# Patient Record
Sex: Female | Born: 1985 | Race: White | Hispanic: No | Marital: Single | Smoking: Never smoker
Health system: Southern US, Community
[De-identification: ages and names within clinical notes are randomized; demographics above are authoritative.]

## PROBLEM LIST (undated history)

## (undated) DIAGNOSIS — R55 Syncope and collapse: Secondary | ICD-10-CM

## (undated) DIAGNOSIS — F32A Depression, unspecified: Secondary | ICD-10-CM

## (undated) DIAGNOSIS — K589 Irritable bowel syndrome without diarrhea: Secondary | ICD-10-CM

## (undated) DIAGNOSIS — G43909 Migraine, unspecified, not intractable, without status migrainosus: Secondary | ICD-10-CM

## (undated) HISTORY — PX: RECONSTRUCTION / CORRECTION OF NIPPLE / AEROLA: SUR1073

## (undated) HISTORY — PX: KNEE ARTHROSCOPY: SHX127

---
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
## 2005-05-10 ENCOUNTER — Other Ambulatory Visit: Admission: RE | Admit: 2005-05-10 | Discharge: 2005-05-10 | Payer: Self-pay | Admitting: Gynecology

## 2006-05-11 ENCOUNTER — Other Ambulatory Visit: Admission: RE | Admit: 2006-05-11 | Discharge: 2006-05-11 | Payer: Self-pay | Admitting: Gynecology

## 2007-02-22 ENCOUNTER — Emergency Department (HOSPITAL_COMMUNITY): Admission: EM | Admit: 2007-02-22 | Discharge: 2007-02-22 | Payer: Self-pay | Admitting: Emergency Medicine

## 2007-05-09 ENCOUNTER — Other Ambulatory Visit: Admission: RE | Admit: 2007-05-09 | Discharge: 2007-05-09 | Payer: Self-pay | Admitting: Gynecology

## 2008-03-15 ENCOUNTER — Other Ambulatory Visit: Admission: RE | Admit: 2008-03-15 | Discharge: 2008-03-15 | Payer: Self-pay | Admitting: Gynecology

## 2011-01-12 LAB — POCT I-STAT CREATININE
Creatinine, Ser: 1.1
Operator id: 288331

## 2011-01-12 LAB — I-STAT 8, (EC8 V) (CONVERTED LAB)
Acid-base deficit: 3 — ABNORMAL HIGH
Operator id: 288331
TCO2: 22
pCO2, Ven: 32.8 — ABNORMAL LOW
pH, Ven: 7.421 — ABNORMAL HIGH

## 2011-01-12 LAB — POCT PREGNANCY, URINE
Operator id: 288831
Preg Test, Ur: NEGATIVE

## 2011-01-12 LAB — RAPID URINE DRUG SCREEN, HOSP PERFORMED
Barbiturates: NOT DETECTED
Benzodiazepines: NOT DETECTED
Cocaine: NOT DETECTED
Opiates: NOT DETECTED

## 2011-01-12 LAB — POCT CARDIAC MARKERS: Troponin i, poc: <0.05

## 2018-02-24 ENCOUNTER — Other Ambulatory Visit: Payer: Self-pay | Admitting: Obstetrics and Gynecology

## 2018-02-24 DIAGNOSIS — N644 Mastodynia: Secondary | ICD-10-CM

## 2018-03-06 ENCOUNTER — Ambulatory Visit
Admission: RE | Admit: 2018-03-06 | Discharge: 2018-03-06 | Disposition: A | Discharge status: Home / Self Care | Payer: PRIVATE HEALTH INSURANCE | Source: Ambulatory Visit | Attending: Obstetrics and Gynecology | Admitting: Obstetrics and Gynecology

## 2018-03-06 DIAGNOSIS — N644 Mastodynia: Secondary | ICD-10-CM

## 2020-01-07 ENCOUNTER — Emergency Department: Admit: 2020-01-07 | Discharge status: Absent without leave | Payer: PRIVATE HEALTH INSURANCE | Source: Home / Self Care

## 2020-01-07 ENCOUNTER — Emergency Department (INDEPENDENT_AMBULATORY_CARE_PROVIDER_SITE_OTHER)
Admission: EM | Admit: 2020-01-07 | Discharge: 2020-01-07 | Disposition: A | Discharge status: Home / Self Care | Payer: BLUE CROSS/BLUE SHIELD | Source: Home / Self Care

## 2020-01-07 ENCOUNTER — Other Ambulatory Visit: Payer: Self-pay

## 2020-01-07 DIAGNOSIS — H6692 Otitis media, unspecified, left ear: Secondary | ICD-10-CM | POA: Diagnosis not present

## 2020-01-07 DIAGNOSIS — H9203 Otalgia, bilateral: Secondary | ICD-10-CM | POA: Diagnosis not present

## 2020-01-07 HISTORY — DX: Depression, unspecified: F32.A

## 2020-01-07 HISTORY — DX: Migraine, unspecified, not intractable, without status migrainosus: G43.909

## 2020-01-07 HISTORY — DX: Irritable bowel syndrome, unspecified: K58.9

## 2020-01-07 MED ORDER — FLUTICASONE PROPIONATE 50 MCG/ACT NA SUSP: 2.0000 | Freq: Every day | NASAL | 2 refills | Status: DC

## 2020-01-07 MED ORDER — AMOXICILLIN-POT CLAVULANATE 875-125 MG PO TABS: 1.0000 | ORAL_TABLET | Freq: Two times a day (BID) | ORAL | 0 refills | Status: DC

## 2020-01-07 NOTE — ED Triage Notes (Signed)
Pt presents to Urgent Care with c/o bilateral ear pain. 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 Actual pain began today. No known covid exposure and pt fully vaccinated.

## 2020-01-07 NOTE — ED Provider Notes (Signed)
[facility]    CSN: [id number] Arrival date & time: 01/07/20  1233      History   Chief Complaint Chief Complaint  Patient presents with  . Otalgia    bilateral    HPI Jacqueline Mccarthy is a 34 y.o. female.   HPI Jacqueline Mccarthy is a 34 y.o. female presenting to UC with c/o bilateral ear pain and pressure. 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 She has had increased pressure the last few weeks but developed pain today. Pain is sharp and aching.  Denies fever, chills, n/v/d. No congestion, HA or dizziness. She has not tried anything for her symptoms because she cannot take a lot of medications due to her occupation. She is fully vaccinated for COVID.    Past Medical History:  Diagnosis Date  . Depression   . IBS (irritable bowel syndrome)   . Migraines     There are no problems to display for this patient.   History reviewed. No pertinent surgical history.  OB History   No obstetric history on file.      Home Medications    Prior to Admission medications   Medication Sig Start Date End Date Taking? Authorizing Provider  buPROPion (WELLBUTRIN XL) 300 MG 24 hr tablet Take 300 mg by mouth 1 day or 1 dose. 10/29/19  Yes [provider]  norethindrone-ethinyl estradiol 1/35 (ORTHO-NOVUM) tablet Take 1 tablet by mouth daily. 02/22/14  Yes [provider]  pantoprazole (PROTONIX) 40 MG tablet Take 1 tablet by mouth daily. 08/01/19  Yes [provider]  amoxicillin-clavulanate (AUGMENTIN) 875-125 MG tablet Take 1 tablet by mouth 2 (two) times daily. One po bid x 7 days 01/07/20   Noe Gens, PA-C  fluticasone [facility]) 50 MCG/ACT nasal spray Place 2 sprays into both nostrils daily. 01/07/20   Noe Gens, PA-C  loperamide (IMODIUM) 2 MG capsule Take 2 mg by mouth daily.    [provider]    Family History Family  History  Problem Relation Age of Onset  . Rheum arthritis Mother   . Heart attack Father     Social History Social History   Tobacco Use  . Smoking status: Never Smoker  . Smokeless tobacco: Never Used  Substance Use Topics  . Alcohol use: Not Currently  . Drug use: Not Currently     Allergies   Contrast media [iodinated diagnostic agents]   Review of Systems Review of Systems  Constitutional: Negative for chills and fever.  HENT: Positive for ear pain. Negative for congestion, sore throat, trouble swallowing and voice change.   Respiratory: Negative for cough and shortness of breath.   Cardiovascular: Negative for chest pain and palpitations.  Gastrointestinal: Negative for abdominal pain, diarrhea, nausea and vomiting.  Musculoskeletal: Negative for arthralgias, back pain and myalgias.  Skin: Negative for rash.  Neurological: Negative for dizziness, light-headedness and headaches.  All other systems reviewed and are negative.    Physical Exam Triage Vital Signs ED Triage Vitals  Enc Vitals Group     BP 01/07/20 1256 (!) 147/88     Pulse Rate 01/07/20 1256 96     Resp 01/07/20 1256 20     Temp 01/07/20 1256 (!) 97.4 F (36.3 C)     Temp Source 01/07/20 1256 Oral     SpO2 01/07/20 1256 98 %  Weight --      Height --      Head Circumference --      Peak Flow --      Pain Score 01/07/20 1254 3     Pain Loc --      Pain Edu? --      Excl. in [place]? --    No data found.  Updated Vital Signs BP (!) 147/88 (BP Location: Right Arm)   Pulse 96   Temp (!) 97.4 F (36.3 C) (Oral)   Resp 20   LMP 12/27/2019 (Approximate)   SpO2 98%   Visual Acuity Right Eye Distance:   Left Eye Distance:   Bilateral Distance:    Right Eye Near:   Left Eye Near:    Bilateral Near:     Physical Exam Vitals and nursing note reviewed.  Constitutional:      General: She is not in acute distress.    Appearance: Normal appearance. She is well-developed. She is not  ill-appearing, toxic-appearing or diaphoretic.  HENT:     Head: Normocephalic and atraumatic.     Right Ear: Tympanic membrane and ear canal normal.     Left Ear: Tympanic membrane and ear canal normal.     Nose: Nose normal.     Right Sinus: No maxillary sinus tenderness or frontal sinus tenderness.     Left Sinus: No maxillary sinus tenderness or frontal sinus tenderness.     Mouth/Throat:     Lips: Pink.     Mouth: Mucous membranes are moist.     Pharynx: Oropharynx is clear. Uvula midline. No pharyngeal swelling, oropharyngeal exudate, posterior oropharyngeal erythema or uvula swelling.  Cardiovascular:     Rate and Rhythm: Normal rate and regular rhythm.  Pulmonary:     Effort: Pulmonary effort is normal. No respiratory distress.     Breath sounds: Normal breath sounds. No stridor. No wheezing, rhonchi or rales.  Musculoskeletal:        General: Normal range of motion.     Cervical back: Normal range of motion and neck supple. No tenderness.  Lymphadenopathy:     Cervical: No cervical adenopathy.  Skin:    General: Skin is warm and dry.  Neurological:     Mental Status: She is alert and oriented to person, place, and time.  Psychiatric:        Behavior: Behavior normal.      UC Treatments / Results  Labs (all labs ordered are listed, but only abnormal results are displayed) Labs Reviewed - No data to display  EKG   Radiology No results found.  Procedures Procedures (including critical care time)  Medications Ordered in UC Medications - No data to display  Initial Impression / Assessment and Plan / UC Course  I have reviewed the triage vital signs and the nursing notes.  Pertinent labs & imaging results that were available during my care of the patient were reviewed by me and considered in my medical decision making (see chart for details).     Tympanometry: RIGHT Ear: Normal. LEFT Ear: tympanogram is Wide Will tx for subacute AOM of Left ear Home care  instructions discussed F/u with PCP as needed  Final Clinical Impressions(s) / UC Diagnoses   Final diagnoses:  Acute ear pain, bilateral  Left acute otitis media     Discharge Instructions      Please take antibiotics as prescribed and be sure to complete entire course even if you start to feel better to  ensure infection does not come back.  You may take 500mg  acetaminophen every 4-6 hours or in combination with ibuprofen 400-600mg  every 6-8 hours as needed for pain, inflammation, and fever.  Be sure to well hydrated with clear liquids and get at least 8 hours of sleep at night, preferably more while sick.   Please follow up with family medicine in 1 week if needed.     ED Prescriptions    Medication Sig Dispense Auth. Provider   amoxicillin-clavulanate (AUGMENTIN) 875-125 MG tablet Take 1 tablet by mouth 2 (two) times daily. One po bid x 7 days 14 tablet Demetria Lightsey O, PA-C   fluticasone (FLONASE) 50 MCG/ACT nasal spray Place 2 sprays into both nostrils daily. 16 g Noe Gens, [place]     PDMP not reviewed this encounter.   Noe Gens, [place] 01/07/20 1424

## 2020-01-07 NOTE — Discharge Instructions (Signed)

## 2021-05-29 ENCOUNTER — Other Ambulatory Visit: Payer: Self-pay | Admitting: Obstetrics and Gynecology

## 2021-05-29 DIAGNOSIS — Z1231 Encounter for screening mammogram for malignant neoplasm of breast: Secondary | ICD-10-CM

## 2021-07-10 ENCOUNTER — Ambulatory Visit
Admission: RE | Admit: 2021-07-10 | Discharge: 2021-07-10 | Disposition: A | Discharge status: Home / Self Care | Payer: BC Managed Care – PPO | Source: Ambulatory Visit | Attending: Obstetrics and Gynecology | Admitting: Obstetrics and Gynecology

## 2021-07-10 DIAGNOSIS — Z1231 Encounter for screening mammogram for malignant neoplasm of breast: Secondary | ICD-10-CM

## 2021-07-20 ENCOUNTER — Other Ambulatory Visit: Payer: Self-pay | Admitting: Obstetrics and Gynecology

## 2021-07-20 DIAGNOSIS — Z9189 Other specified personal risk factors, not elsewhere classified: Secondary | ICD-10-CM

## 2021-08-06 ENCOUNTER — Ambulatory Visit
Admission: RE | Admit: 2021-08-06 | Discharge: 2021-08-06 | Disposition: A | Discharge status: Home / Self Care | Payer: BC Managed Care – PPO | Source: Ambulatory Visit | Attending: Obstetrics and Gynecology | Admitting: Obstetrics and Gynecology

## 2021-08-06 DIAGNOSIS — Z9189 Other specified personal risk factors, not elsewhere classified: Secondary | ICD-10-CM

## 2021-08-06 MED ORDER — GADOBUTROL 1 MMOL/ML IV SOLN
10.0000 mL | Freq: Once | INTRAVENOUS | Status: AC | PRN
  Administered 2021-08-06: 10 mL via INTRAVENOUS

## 2021-08-07 ENCOUNTER — Other Ambulatory Visit: Payer: Self-pay | Admitting: Obstetrics and Gynecology

## 2021-08-07 DIAGNOSIS — R9389 Abnormal findings on diagnostic imaging of other specified body structures: Secondary | ICD-10-CM

## 2021-08-26 ENCOUNTER — Ambulatory Visit
Admission: RE | Admit: 2021-08-26 | Discharge: 2021-08-26 | Disposition: A | Discharge status: Home / Self Care | Payer: BC Managed Care – PPO | Source: Ambulatory Visit | Attending: Obstetrics and Gynecology | Admitting: Obstetrics and Gynecology

## 2021-08-26 DIAGNOSIS — R9389 Abnormal findings on diagnostic imaging of other specified body structures: Secondary | ICD-10-CM

## 2021-08-26 MED ORDER — GADOBUTROL 1 MMOL/ML IV SOLN
9.0000 mL | Freq: Once | INTRAVENOUS | Status: AC | PRN
  Administered 2021-08-26: 9 mL via INTRAVENOUS

## 2021-08-27 ENCOUNTER — Other Ambulatory Visit: Payer: Self-pay | Admitting: Obstetrics and Gynecology

## 2021-08-27 DIAGNOSIS — R9389 Abnormal findings on diagnostic imaging of other specified body structures: Secondary | ICD-10-CM

## 2021-09-02 ENCOUNTER — Telehealth: Payer: Self-pay

## 2021-09-02 NOTE — Telephone Encounter (Signed)
Phone call to patient to review instructions for 13 hr prep for MRI w/ contrast on 09/09/21 @ 7:10 AM. Prescription called into [facility]. Pt aware and verbalized understanding of instructions. Prescription: 09/08/21 @ 1810 - '50mg'$  Prednisone 09/09/21 @ 0010 - '50mg'$  Prednisone 09/09/21 @ 0610  - '50mg'$  Prednisone and '50mg'$  Benadryl   Pt did not wish for benadryl to be called in as a separate prescription. Pt states she has benadryl at home and understands when to take the benadryl. Pt also advised to have a driver the day of taking this medication. Pt verbalized understanding.

## 2021-09-09 ENCOUNTER — Other Ambulatory Visit (HOSPITAL_COMMUNITY): Payer: Self-pay | Admitting: Diagnostic Radiology

## 2021-09-09 ENCOUNTER — Ambulatory Visit
Admission: RE | Admit: 2021-09-09 | Discharge: 2021-09-09 | Disposition: A | Discharge status: Home / Self Care | Payer: BC Managed Care – PPO | Source: Ambulatory Visit | Attending: Obstetrics and Gynecology | Admitting: Obstetrics and Gynecology

## 2021-09-09 DIAGNOSIS — R9389 Abnormal findings on diagnostic imaging of other specified body structures: Secondary | ICD-10-CM

## 2021-09-25 ENCOUNTER — Ambulatory Visit: Payer: Self-pay | Admitting: Surgery

## 2021-09-25 DIAGNOSIS — D242 Benign neoplasm of left breast: Secondary | ICD-10-CM

## 2021-09-30 ENCOUNTER — Other Ambulatory Visit: Payer: Self-pay | Admitting: Surgery

## 2021-09-30 DIAGNOSIS — D242 Benign neoplasm of left breast: Secondary | ICD-10-CM

## 2021-11-11 ENCOUNTER — Encounter (HOSPITAL_BASED_OUTPATIENT_CLINIC_OR_DEPARTMENT_OTHER): Payer: Self-pay | Admitting: Surgery

## 2021-11-12 ENCOUNTER — Encounter (HOSPITAL_BASED_OUTPATIENT_CLINIC_OR_DEPARTMENT_OTHER): Payer: Self-pay | Admitting: Surgery

## 2021-11-12 ENCOUNTER — Other Ambulatory Visit: Payer: Self-pay

## 2021-11-17 ENCOUNTER — Encounter (HOSPITAL_BASED_OUTPATIENT_CLINIC_OR_DEPARTMENT_OTHER)
Admission: RE | Admit: 2021-11-17 | Discharge: 2021-11-17 | Disposition: A | Discharge status: Home / Self Care | Payer: BC Managed Care – PPO | Source: Ambulatory Visit | Attending: Surgery | Admitting: Surgery

## 2021-11-17 ENCOUNTER — Ambulatory Visit
Admission: RE | Admit: 2021-11-17 | Discharge: 2021-11-17 | Disposition: A | Discharge status: Home / Self Care | Payer: BC Managed Care – PPO | Source: Ambulatory Visit | Attending: Surgery | Admitting: Surgery

## 2021-11-17 DIAGNOSIS — N6082 Other benign mammary dysplasias of left breast: Secondary | ICD-10-CM | POA: Diagnosis not present

## 2021-11-17 DIAGNOSIS — Z6836 Body mass index (BMI) 36.0-36.9, adult: Secondary | ICD-10-CM | POA: Diagnosis not present

## 2021-11-17 DIAGNOSIS — Z01812 Encounter for preprocedural laboratory examination: Secondary | ICD-10-CM | POA: Insufficient documentation

## 2021-11-17 DIAGNOSIS — K219 Gastro-esophageal reflux disease without esophagitis: Secondary | ICD-10-CM | POA: Diagnosis not present

## 2021-11-17 DIAGNOSIS — N6022 Fibroadenosis of left breast: Secondary | ICD-10-CM | POA: Diagnosis not present

## 2021-11-17 DIAGNOSIS — D242 Benign neoplasm of left breast: Secondary | ICD-10-CM

## 2021-11-17 DIAGNOSIS — N6042 Mammary duct ectasia of left breast: Secondary | ICD-10-CM | POA: Diagnosis not present

## 2021-11-17 DIAGNOSIS — E669 Obesity, unspecified: Secondary | ICD-10-CM | POA: Diagnosis not present

## 2021-11-17 DIAGNOSIS — Z803 Family history of malignant neoplasm of breast: Secondary | ICD-10-CM | POA: Diagnosis not present

## 2021-11-17 LAB — POCT PREGNANCY, URINE: Preg Test, Ur: NEGATIVE

## 2021-11-17 NOTE — Progress Notes (Signed)
Anesthesia consult per Dr. Royce Macadamia, will proceed with surgery as scheduled.

## 2021-11-17 NOTE — Progress Notes (Signed)

## 2021-11-17 NOTE — Anesthesia Preprocedure Evaluation (Signed)
Anesthesia Evaluation  Patient identified by MRN, date of birth, ID band Patient awake    Reviewed: Allergy & Precautions, NPO status , Patient's Chart, lab work & pertinent test results  Airway Mallampati: II  TM Distance: >3 FB Neck ROM: Full    Dental no notable dental hx. (+) Teeth Intact, Dental Advisory Given   Pulmonary neg pulmonary ROS,    Pulmonary exam normal breath sounds clear to auscultation       Cardiovascular Exercise Tolerance: Good negative cardio ROS Normal cardiovascular exam Rhythm:Regular Rate:Normal     Neuro/Psych  Headaches,    GI/Hepatic Neg liver ROS, GERD  ,  Endo/Other  negative endocrine ROS  Renal/GU negative Renal ROS     Musculoskeletal negative musculoskeletal ROS (+)   Abdominal (+) + obese (BMI 36.6),   Peds  Hematology   Anesthesia Other Findings All: gadolinium, lidocaine, contrast  L Breast CA  Reproductive/Obstetrics                            Anesthesia Physical Anesthesia Plan  ASA: 3  Anesthesia Plan: General   Post-op Pain Management: Toradol IV (intra-op)*, Tylenol PO (pre-op)* and Precedex   Induction: Intravenous  PONV Risk Score and Plan: 3 and Treatment may vary due to age or medical condition, TIVA, Propofol infusion and Midazolam  Airway Management Planned: LMA  Additional Equipment: None  Intra-op Plan:   Post-operative Plan:   Informed Consent: I have reviewed the patients History and Physical, chart, labs and discussed the procedure including the risks, benefits and alternatives for the proposed anesthesia with the patient or authorized representative who has indicated his/her understanding and acceptance.     Dental advisory given  Plan Discussed with:   Anesthesia Plan Comments: (TIVA GA w LMA)      Anesthesia Quick Evaluation

## 2021-11-18 ENCOUNTER — Encounter (HOSPITAL_BASED_OUTPATIENT_CLINIC_OR_DEPARTMENT_OTHER)
Admission: RE | Disposition: A | Discharge status: Home / Self Care | Payer: Self-pay | Source: Ambulatory Visit | Attending: Surgery

## 2021-11-18 ENCOUNTER — Other Ambulatory Visit: Payer: Self-pay

## 2021-11-18 ENCOUNTER — Ambulatory Visit (HOSPITAL_BASED_OUTPATIENT_CLINIC_OR_DEPARTMENT_OTHER): Payer: BC Managed Care – PPO | Admitting: Anesthesiology

## 2021-11-18 ENCOUNTER — Encounter (HOSPITAL_BASED_OUTPATIENT_CLINIC_OR_DEPARTMENT_OTHER): Payer: Self-pay | Admitting: Surgery

## 2021-11-18 ENCOUNTER — Ambulatory Visit
Admission: RE | Admit: 2021-11-18 | Discharge: 2021-11-18 | Disposition: A | Discharge status: Home / Self Care | Payer: BC Managed Care – PPO | Source: Ambulatory Visit | Attending: Surgery | Admitting: Surgery

## 2021-11-18 ENCOUNTER — Ambulatory Visit (HOSPITAL_BASED_OUTPATIENT_CLINIC_OR_DEPARTMENT_OTHER)
Admission: RE | Admit: 2021-11-18 | Discharge: 2021-11-18 | Disposition: A | Discharge status: Home / Self Care | Payer: BC Managed Care – PPO | Source: Ambulatory Visit | Attending: Surgery | Admitting: Surgery

## 2021-11-18 DIAGNOSIS — N6022 Fibroadenosis of left breast: Secondary | ICD-10-CM | POA: Insufficient documentation

## 2021-11-18 DIAGNOSIS — D242 Benign neoplasm of left breast: Secondary | ICD-10-CM | POA: Diagnosis not present

## 2021-11-18 DIAGNOSIS — N6082 Other benign mammary dysplasias of left breast: Secondary | ICD-10-CM | POA: Insufficient documentation

## 2021-11-18 DIAGNOSIS — Z01818 Encounter for other preprocedural examination: Secondary | ICD-10-CM

## 2021-11-18 DIAGNOSIS — N6042 Mammary duct ectasia of left breast: Secondary | ICD-10-CM | POA: Insufficient documentation

## 2021-11-18 DIAGNOSIS — K219 Gastro-esophageal reflux disease without esophagitis: Secondary | ICD-10-CM | POA: Insufficient documentation

## 2021-11-18 DIAGNOSIS — E669 Obesity, unspecified: Secondary | ICD-10-CM | POA: Insufficient documentation

## 2021-11-18 DIAGNOSIS — Z6836 Body mass index (BMI) 36.0-36.9, adult: Secondary | ICD-10-CM | POA: Insufficient documentation

## 2021-11-18 DIAGNOSIS — Z803 Family history of malignant neoplasm of breast: Secondary | ICD-10-CM | POA: Insufficient documentation

## 2021-11-18 HISTORY — PX: BREAST LUMPECTOMY WITH RADIOACTIVE SEED LOCALIZATION: SHX6424

## 2021-11-18 HISTORY — DX: Syncope and collapse: R55

## 2021-11-18 SURGERY — BREAST LUMPECTOMY WITH RADIOACTIVE SEED LOCALIZATION
Anesthesia: General | Site: Breast | Laterality: Left

## 2021-11-18 MED ORDER — DEXAMETHASONE SODIUM PHOSPHATE 10 MG/ML IJ SOLN
INTRAMUSCULAR | Status: DC | PRN
  Administered 2021-11-18: 5 mg via INTRAVENOUS

## 2021-11-18 MED ORDER — CHLORHEXIDINE GLUCONATE CLOTH 2 % EX PADS: 6.0000 | MEDICATED_PAD | Freq: Once | CUTANEOUS | Status: DC

## 2021-11-18 MED ORDER — HYDROMORPHONE HCL 1 MG/ML IJ SOLN: 0.2500 mg | INTRAMUSCULAR | Status: DC | PRN

## 2021-11-18 MED ORDER — BUPIVACAINE-EPINEPHRINE 0.25% -1:200000 IJ SOLN
INTRAMUSCULAR | Status: DC | PRN
  Administered 2021-11-18: 20 mL

## 2021-11-18 MED ORDER — OXYCODONE HCL 5 MG PO TABS: 5.0000 mg | ORAL_TABLET | Freq: Once | ORAL | Status: DC | PRN

## 2021-11-18 MED ORDER — FENTANYL CITRATE (PF) 100 MCG/2ML IJ SOLN
INTRAMUSCULAR | Status: DC | PRN
  Administered 2021-11-18 (×4): 25 ug via INTRAVENOUS

## 2021-11-18 MED ORDER — ACETAMINOPHEN 500 MG PO TABS
ORAL_TABLET | ORAL | Status: AC
  Filled 2021-11-18: qty 2

## 2021-11-18 MED ORDER — LACTATED RINGERS IV SOLN: INTRAVENOUS | Status: DC

## 2021-11-18 MED ORDER — ACETAMINOPHEN 500 MG PO TABS
1000.0000 mg | ORAL_TABLET | ORAL | Status: AC
  Administered 2021-11-18: 1000 mg via ORAL

## 2021-11-18 MED ORDER — ONDANSETRON HCL 4 MG/2ML IJ SOLN
4.0000 mg | Freq: Once | INTRAMUSCULAR | Status: DC | PRN
Start: 2021-11-18 — End: 2021-11-18

## 2021-11-18 MED ORDER — MIDAZOLAM HCL 5 MG/5ML IJ SOLN
INTRAMUSCULAR | Status: DC | PRN
  Administered 2021-11-18: 2 mg via INTRAVENOUS

## 2021-11-18 MED ORDER — CEFAZOLIN SODIUM-DEXTROSE 2-4 GM/100ML-% IV SOLN
2.0000 g | INTRAVENOUS | Status: AC
  Administered 2021-11-18: 2 g via INTRAVENOUS

## 2021-11-18 MED ORDER — KETOROLAC TROMETHAMINE 30 MG/ML IJ SOLN
INTRAMUSCULAR | Status: DC | PRN
  Administered 2021-11-18: 30 mg via INTRAVENOUS

## 2021-11-18 MED ORDER — CEFAZOLIN SODIUM-DEXTROSE 2-4 GM/100ML-% IV SOLN
INTRAVENOUS | Status: AC
  Filled 2021-11-18: qty 100

## 2021-11-18 MED ORDER — KETOROLAC TROMETHAMINE 30 MG/ML IJ SOLN: 30.0000 mg | Freq: Once | INTRAMUSCULAR | Status: DC | PRN

## 2021-11-18 MED ORDER — MIDAZOLAM HCL 2 MG/2ML IJ SOLN
INTRAMUSCULAR | Status: AC
  Filled 2021-11-18: qty 2

## 2021-11-18 MED ORDER — FENTANYL CITRATE (PF) 100 MCG/2ML IJ SOLN
INTRAMUSCULAR | Status: AC
  Filled 2021-11-18: qty 2

## 2021-11-18 MED ORDER — OXYCODONE HCL 5 MG/5ML PO SOLN: 5.0000 mg | Freq: Once | ORAL | Status: DC | PRN

## 2021-11-18 MED ORDER — PROPOFOL 500 MG/50ML IV EMUL
INTRAVENOUS | Status: DC | PRN
  Administered 2021-11-18: 100 ug/kg/min via INTRAVENOUS
  Administered 2021-11-18: 125 ug/kg/min via INTRAVENOUS

## 2021-11-18 MED ORDER — PROPOFOL 10 MG/ML IV BOLUS
INTRAVENOUS | Status: DC | PRN
  Administered 2021-11-18: 150 mg via INTRAVENOUS

## 2021-11-18 SURGICAL SUPPLY — 47 items
APL PRP STRL LF DISP 70% ISPRP (MISCELLANEOUS) ×1
APL SKNCLS STERI-STRIP NONHPOA (GAUZE/BANDAGES/DRESSINGS) ×1
APPLIER CLIP 9.375 MED OPEN (MISCELLANEOUS) ×2
APR CLP MED 9.3 20 MLT OPN (MISCELLANEOUS) ×1
BENZOIN TINCTURE PRP APPL 2/3 (GAUZE/BANDAGES/DRESSINGS) ×2 IMPLANT
BLADE HEX COATED 2.75 (ELECTRODE) ×2 IMPLANT
BLADE SURG 15 STRL LF DISP TIS (BLADE) ×1 IMPLANT
BLADE SURG 15 STRL SS (BLADE) ×2
CANISTER SUC SOCK COL 7IN (MISCELLANEOUS) IMPLANT
CANISTER SUCT 1200ML W/VALVE (MISCELLANEOUS) IMPLANT
CHLORAPREP W/TINT 26 (MISCELLANEOUS) ×2 IMPLANT
CLIP APPLIE 9.375 MED OPEN (MISCELLANEOUS) ×1 IMPLANT
COVER BACK TABLE 60X90IN (DRAPES) ×2 IMPLANT
COVER MAYO STAND STRL (DRAPES) ×2 IMPLANT
COVER PROBE W GEL 5X96 (DRAPES) ×2 IMPLANT
DRAPE LAPAROTOMY 100X72 PEDS (DRAPES) ×2 IMPLANT
DRAPE UTILITY XL STRL (DRAPES) ×2 IMPLANT
DRSG TEGADERM 4X4.75 (GAUZE/BANDAGES/DRESSINGS) ×2 IMPLANT
ELECT REM PT RETURN 9FT ADLT (ELECTROSURGICAL) ×2
ELECTRODE REM PT RTRN 9FT ADLT (ELECTROSURGICAL) ×1 IMPLANT
GAUZE SPONGE 4X4 12PLY STRL LF (GAUZE/BANDAGES/DRESSINGS) ×1 IMPLANT
GLOVE BIO SURGEON STRL SZ 6.5 (GLOVE) ×1 IMPLANT
GLOVE BIO SURGEON STRL SZ7 (GLOVE) ×2 IMPLANT
GLOVE BIOGEL PI IND STRL 7.5 (GLOVE) ×1 IMPLANT
GLOVE BIOGEL PI INDICATOR 7.5 (GLOVE) ×1
GOWN STRL REUS W/ TWL LRG LVL3 (GOWN DISPOSABLE) ×2 IMPLANT
GOWN STRL REUS W/TWL LRG LVL3 (GOWN DISPOSABLE) ×4
KIT MARKER MARGIN INK (KITS) ×2 IMPLANT
NDL HYPO 25X1 1.5 SAFETY (NEEDLE) ×1 IMPLANT
NEEDLE HYPO 25X1 1.5 SAFETY (NEEDLE) ×2 IMPLANT
NS IRRIG 1000ML POUR BTL (IV SOLUTION) ×2 IMPLANT
PACK BASIN DAY SURGERY FS (CUSTOM PROCEDURE TRAY) ×2 IMPLANT
PENCIL SMOKE EVACUATOR (MISCELLANEOUS) ×2 IMPLANT
SLEEVE SCD COMPRESS KNEE MED (STOCKING) ×2 IMPLANT
SPONGE GAUZE 2X2 8PLY STRL LF (GAUZE/BANDAGES/DRESSINGS) IMPLANT
SPONGE T-LAP 4X18 ~~LOC~~+RFID (SPONGE) ×2 IMPLANT
STRIP CLOSURE SKIN 1/2X4 (GAUZE/BANDAGES/DRESSINGS) ×2 IMPLANT
SUT MON AB 4-0 PC3 18 (SUTURE) ×2 IMPLANT
SUT SILK 2 0 SH (SUTURE) IMPLANT
SUT VIC AB 3-0 SH 27 (SUTURE) ×2
SUT VIC AB 3-0 SH 27X BRD (SUTURE) ×1 IMPLANT
SYR BULB EAR ULCER 3OZ GRN STR (SYRINGE) IMPLANT
SYR CONTROL 10ML LL (SYRINGE) ×2 IMPLANT
TOWEL GREEN STERILE FF (TOWEL DISPOSABLE) ×2 IMPLANT
TRAY FAXITRON CT DISP (TRAY / TRAY PROCEDURE) ×2 IMPLANT
TUBE CONNECTING 20X1/4 (TUBING) ×1 IMPLANT
YANKAUER SUCT BULB TIP NO VENT (SUCTIONS) ×1 IMPLANT

## 2021-11-18 NOTE — Discharge Instructions (Addendum)
[place] Office Phone Number [phone]  BREAST BIOPSY/ PARTIAL MASTECTOMY: POST OP INSTRUCTIONS  Always review your discharge instruction sheet given to you by the facility where your surgery was performed.  IF YOU HAVE DISABILITY OR FAMILY LEAVE FORMS, YOU MUST BRING THEM TO THE OFFICE FOR PROCESSING.  DO NOT GIVE THEM TO YOUR DOCTOR.  A prescription for pain medication may be given to you upon discharge.  Take your pain medication as prescribed, if needed.  If narcotic pain medicine is not needed, then you may take acetaminophen (Tylenol) or ibuprofen (Advil) as needed. Take your usually prescribed medications unless otherwise directed If you need a refill on your pain medication, please contact your pharmacy.  They will contact our office to request authorization.  Prescriptions will not be filled after 5pm or on week-ends. You should eat very light the first 24 hours after surgery, such as soup, crackers, pudding, etc.  Resume your normal diet the day after surgery. Most patients will experience some swelling and bruising in the breast.  Ice packs and a good support bra will help.  Swelling and bruising can take several days to resolve.  It is common to experience some constipation if taking pain medication after surgery.  Increasing fluid intake and taking a stool softener will usually help or prevent this problem from occurring.  A mild laxative (Milk of Magnesia or Miralax) should be taken according to package directions if there are no bowel movements after 48 hours. Unless discharge instructions indicate otherwise, you may remove your bandages 24-48 hours after surgery, and you may shower at that time.  You may have steri-strips (small skin tapes) in place directly over the incision.  These strips should be left on the skin for 7-10 days.  If your surgeon used skin glue on the incision, you may shower in 24 hours.  The glue will flake off over the next 2-3 weeks.  Any  sutures or staples will be removed at the office during your follow-up visit. ACTIVITIES:  You may resume regular daily activities (gradually increasing) beginning the next day.  Wearing a good support bra or sports bra minimizes pain and swelling.  You may have sexual intercourse when it is comfortable. You may drive when you no longer are taking prescription pain medication, you can comfortably wear a seatbelt, and you can safely maneuver your car and apply brakes. RETURN TO WORK:  ______________________________________________________________________________________ Dennis Bast should see your doctor in the office for a follow-up appointment approximately two weeks after your surgery.  Your doctor's nurse will typically make your follow-up appointment when she calls you with your pathology report.  Expect your pathology report 2-3 business days after your surgery.  You may call to check if you do not hear from [place] after three days. OTHER INSTRUCTIONS: _______________________________________________________________________________________________ _____________________________________________________________________________________________________________________________________ _____________________________________________________________________________________________________________________________________ _____________________________________________________________________________________________________________________________________  WHEN TO CALL YOUR DOCTOR: Fever over 101.0 Nausea and/or vomiting. Extreme swelling or bruising. Continued bleeding from incision. Increased pain, redness, or drainage from the incision.  The clinic staff is available to answer your questions during regular business hours.  Please don't hesitate to call and ask to speak to one of the nurses for clinical concerns.  If you have a medical emergency, go to the nearest emergency room or call 911.  A surgeon from [facility] is always on call at the hospital.  For further questions, please visit [email or web address]    No Tylenol before 2:00pm if needed. No ibuprofen before 4:30 pm if needed.  Post  Anesthesia Home Care Instructions  Activity: Get plenty of rest for the remainder of the day. A responsible individual must stay with you for 24 hours following the procedure.  For the next 24 hours, DO NOT: -Drive a car -Paediatric nurse -Drink alcoholic beverages -Take any medication unless instructed by your physician -Make any legal decisions or sign important papers.  Meals: Start with liquid foods such as gelatin or soup. Progress to regular foods as tolerated. Avoid greasy, spicy, heavy foods. If nausea and/or vomiting occur, drink only clear liquids until the nausea and/or vomiting subsides. Call your physician if vomiting continues.  Special Instructions/Symptoms: Your throat may feel dry or sore from the anesthesia or the breathing tube placed in your throat during surgery. If this causes discomfort, gargle with warm salt water. The discomfort should disappear within 24 hours.  If you had a scopolamine patch placed behind your ear for the management of post- operative nausea and/or vomiting:  1. The medication in the patch is effective for 72 hours, after which it should be removed.  Wrap patch in a tissue and discard in the trash. Wash hands thoroughly with soap and water. 2. You may remove the patch earlier than 72 hours if you experience unpleasant side effects which may include dry mouth, dizziness or visual disturbances. 3. Avoid touching the patch. Wash your hands with soap and water after contact with the patch.

## 2021-11-18 NOTE — Transfer of Care (Signed)
Immediate Anesthesia Transfer of Care Note  Patient: Jacqueline Mccarthy  Procedure(s) Performed: LEFT BREAST RADIOACTIVE SEED LOCALIZED LUMPECTOMY (Left: Breast)  Patient Location: PACU  Anesthesia Type:General  Level of Consciousness: drowsy  Airway & Oxygen Therapy: Patient Spontanous Breathing and Patient connected to face mask oxygen  Post-op Assessment: Report given to RN and Post -op Vital signs reviewed and stable  Post vital signs: Reviewed and stable  Last Vitals:  Vitals Value Taken Time  BP 107/68 11/18/21 1047  Temp    Pulse 87 11/18/21 1048  Resp 24 11/18/21 1048  SpO2 93 % 11/18/21 1048  Vitals shown include unvalidated device data.  Last Pain:  Vitals:   11/18/21 0755  TempSrc: Oral  PainSc: 1       Patients Stated Pain Goal: 8 (03/28/48 7530)  Complications: No notable events documented.

## 2021-11-18 NOTE — H&P (Signed)
Chief Complaint: Breast Mass       History of Present Illness: Jacqueline Mccarthy is a 36 y.o. female who is seen today as an office consultation at the request of Dr. Gaetano Net for evaluation of Breast Mass .   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 Crissie Reese in 2020.  Since that time, the patient has had persistent left breast pain that seems generalized throughout the entire breast.  Occasionally this is fairly severe.  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  The patient is high risk for breast cancer because of family history of breast cancer in a paternal aunt in her early 15s.  Genetic testing has been negative.  Recently she underwent bilateral Tomo mammograms that were unremarkable.  Screening breast MRI revealed a 0.5 x 1.5 x 1.0 cm mass in the left retroareolar region.  This was biopsied under MRI guidance and revealed an intraductal papilloma with no sign of malignancy.  She had a second left biopsy showing asymmetrical parenchymal enhancement.  The second biopsy revealed fibrocystic changes but no sign of malignancy or atypia.  She presents now to discuss excision of the intraductal papilloma.     Review of Systems: A complete review of systems was obtained from the patient.  I have reviewed this information and discussed as appropriate with the patient.  See HPI as well for other ROS.   Review of Systems  Constitutional: Negative.   HENT: Negative.    Eyes: Negative.   Respiratory: Negative.    Cardiovascular: Negative.   Gastrointestinal: Negative.   Genitourinary: Negative.   Musculoskeletal: Negative.   Skin: Negative.   Neurological: Negative.   Endo/Heme/Allergies: Negative.   Psychiatric/Behavioral: Negative.         Medical History: Past Medical History  History  reviewed. No pertinent past medical history.           Patient Active Problem List  Diagnosis   Adenoma of nipple   At high risk for breast cancer   Anxiety   Irritable bowel syndrome with diarrhea   GERD (gastroesophageal reflux disease)   Migraine without aura and without status migrainosus, not intractable   Obesity      Past Surgical History           Past Surgical History:  Procedure Laterality Date   nipple adenoma       RECONSTRUCTION / CORRECTION OF NIPPLE / AEROLA            Allergies           Allergies  Allergen Reactions   Iodinated Contrast Media Hives   Fexofenadine Itching      Burning on arms and itching back of throat                   Current Outpatient Medications on File Prior to Visit  Medication Sig Dispense Refill   buPROPion (WELLBUTRIN XL) 300 MG XL tablet bupropion HCl XL 300 mg 24 hr tablet, extended release  TAKE 1 TABLET BY MOUTH EVERY DAY IN THE MORNING       rizatriptan (MAXALT) 10 MG tablet rizatriptan 10 mg tablet       loperamide (IMODIUM) 2 mg capsule Take by mouth at bedtime as needed       pantoprazole (PROTONIX) 20 MG DR  tablet Take 20 mg by mouth once daily        No current facility-administered medications on file prior to visit.      Family History           Family History  Problem Relation Age of Onset   High blood pressure (Hypertension) Father     Hyperlipidemia (Elevated cholesterol) Father     Coronary Artery Disease (Blocked arteries around heart) Father     Breast cancer Other          Social History         Tobacco Use  Smoking Status Never  Smokeless Tobacco Never      Social History  Social History           Socioeconomic History   Marital status: Single  Tobacco Use   Smoking status: Never   Smokeless tobacco: Never  Vaping Use   Vaping Use: Never used  Substance and Sexual Activity   Alcohol use: Yes   Drug use: Never        Objective:           Vitals:    09/25/21 1045  BP:  120/64  Pulse: (!) 112  Temp: 36.7 C (98.1 F)  SpO2: 98%  Weight: (!) 100.9 kg (222 lb 6.4 oz)  Height: 162.6 cm ('5\' 4"'$ )    Body mass index is 38.17 kg/m.   Physical Exam    Constitutional:  WDWN in NAD, conversant, no obvious deformities; lying in bed comfortably Eyes:  Pupils equal, round; sclera anicteric; moist conjunctiva; no lid lag HENT:  Oral mucosa moist; good dentition  Neck:  No masses palpated, trachea midline; no thyromegaly Lungs:  CTA bilaterally; normal respiratory effort Breasts:  symmetric, no nipple changes; no palpable masses or lymphadenopathy on either side CV:  Regular rate and rhythm; no murmurs; extremities well-perfused with no edema Abd:  +bowel sounds, soft, non-tender, no palpable organomegaly; no palpable hernias Musc:  Normal gait; no apparent clubbing or cyanosis in extremities Lymphatic:  No palpable cervical or axillary lymphadenopathy Skin:  Warm, dry; no sign of jaundice Psychiatric - alert and oriented x 4; calm mood and affect     Labs, Imaging and Diagnostic Testing:   CLINICAL DATA:  High risk screening breast MRI. Family history breast cancer in paternal aunt at age 59 as well as paternal grandmother at age 66. Previous resection left nipple due to benign left nipple adenoma 2019 with subsequent left nipple reconstruction January 2020.   EXAM: BILATERAL BREAST MRI WITH AND WITHOUT CONTRAST   TECHNIQUE: Multiplanar, multisequence MR images of both breasts were obtained prior to and following the intravenous administration of 10 ml of Gadavist.   Three-dimensional MR images were rendered by post-processing of the original MR data on an independent workstation. The three-dimensional MR images were interpreted, and findings are reported in the following complete MRI report for this study. Three dimensional images were evaluated at the independent interpreting workstation using the DynaCAD thin client.   COMPARISON:  Previous  exams.   FINDINGS: Breast composition: c. Heterogeneous fibroglandular tissue.   Background parenchymal enhancement: Moderate, but slightly asymmetric left greater than right.   Right breast: No suspicious mass or abnormal enhancement.   Left breast: There is focal non mass enhancement over the anterior inner midportion of the periareolar region measuring approximately 0.5 x 1.0 x 1.5 cm (series 6, image 64-75). This has mixed kinetics. Remainder of the left breast is unremarkable.   Lymph nodes:  No abnormal appearing lymph nodes.   Ancillary findings:  None.   IMPRESSION: Indeterminate focal non mass enhancement over the anterior inner left periareolar region measuring 0.5 x 1.0 x 1.5 cm (series 6, image 60 4-75). Presumed mild asymmetric moderate background enhancement left breast on this baseline high risk screening breast MRI.   RECOMMENDATION: Recommend MRI guided biopsy of this indeterminate left periareolar non mass enhancement. Also recommend six-month follow-up breast MRI to confirm stability of this presumed asymmetric background enhancement pattern.   BI-RADS CATEGORY  4: Suspicious.     Electronically Signed   By: Marin Olp M.D.   On: 08/06/2021 12:34   Assessment and Plan:  Diagnoses and all orders for this visit:   Intraductal papilloma of breast, left   Mastodynia of left breast   Other orders -     gabapentin (NEURONTIN) 100 MG capsule; Take 1 capsule (100 mg total) by mouth 3 (three) times daily     For the papilloma, would recommend left breast radioactive seed localized lumpectomy.The surgical procedure has been discussed with the patient.  Potential risks, benefits, alternative treatments, and expected outcomes have been explained.  All of the patient's questions at this time have been answered.  The likelihood of reaching the patient's treatment goal is good.  The patient understand the proposed surgical procedure and wishes to proceed.      For the chronic left breast generalized pain, we will try a a low-dose of gabapentin 100 mg 3 times daily to see if this helps with what appears to be neuropathy.     Imogene Burn. Georgette Dover, MD, [facility] Surgery  General Surgery   11/18/2021 8:25 AM

## 2021-11-18 NOTE — Anesthesia Postprocedure Evaluation (Signed)
Anesthesia Post Note  Patient: Jacqueline Mccarthy  Procedure(s) Performed: LEFT BREAST RADIOACTIVE SEED LOCALIZED LUMPECTOMY (Left: Breast)     Patient location during evaluation: PACU Anesthesia Type: General Level of consciousness: awake and alert Pain management: pain level controlled Vital Signs Assessment: post-procedure vital signs reviewed and stable Respiratory status: spontaneous breathing, nonlabored ventilation, respiratory function stable and patient connected to nasal cannula oxygen Cardiovascular status: blood pressure returned to baseline and stable Postop Assessment: no apparent nausea or vomiting Anesthetic complications: no   No notable events documented.  Last Vitals:  Vitals:   11/18/21 1125 11/18/21 1152  BP: 112/86 113/71  Pulse: 78 84  Resp: 12 18  Temp:  36.4 C  SpO2: 99% 97%    Last Pain:  Vitals:   11/18/21 1152  TempSrc: Oral  PainSc: 0-No pain                 Barnet Glasgow

## 2021-11-18 NOTE — Op Note (Signed)
Pre-op Diagnosis:  Left breast intraductal papilloma Post-op Diagnosis: same Procedure:  Left radioactive seed localized lumpectomy Surgeon:  Lalaine Overstreet K. Anesthesia:  GEN - LMA Indications:  This is a 36 year old female in otherwise good health who underwent excision of a large intraductal papilloma of the left nipple which was managed with excision and nipple reconstruction by Dr. Crissie Reese in 2020.  Since that time, the patient has had persistent left breast pain that seems generalized throughout the entire breast.  Occasionally this is fairly severe.  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  The patient is high risk for breast cancer because of family history of breast cancer in a paternal aunt in her early 66s.  Genetic testing has been negative.  Recently she underwent bilateral Tomo mammograms that were unremarkable.  Screening breast MRI revealed a 0.5 x 1.5 x 1.0 cm mass in the left retroareolar region.  This was biopsied under MRI guidance and revealed an intraductal papilloma with no sign of malignancy.  She had a second left biopsy showing asymmetrical parenchymal enhancement.  The second biopsy revealed fibrocystic changes but no sign of malignancy or atypia.  She presents now to discuss excision of the intraductal papilloma. Description of procedure: The patient is brought to the operating room placed in supine position on the operating room table. After an adequate level of general anesthesia was obtained, her left breast was prepped with ChloraPrep and draped in sterile fashion. A timeout was taken to ensure the proper patient and proper procedure. We interrogated the breast with the neoprobe. We made a circumareolar incision around the medial side of the nipple after infiltrating with 0.25% Marcaine. Dissection was carried down in the breast tissue with cautery.  The subcutaneous breast tissue is quite dense due to the previous surgery.    We used the neoprobe to guide [place] towards the radioactive seed. We excised an area of tissue around the radioactive seed 1.5 cm in diameter. The specimen was removed and was oriented with a paint kit. Specimen mammogram showed the radioactive seed as well as the biopsy clip within the specimen. This was sent for pathologic examination. There is no residual radioactivity within the biopsy cavity. We inspected carefully for hemostasis. The wound was thoroughly irrigated. The wound was closed with a deep layer of 3-0 Vicryl and a subcuticular layer of 4-0 Monocryl. Benzoin Steri-Strips were applied. The patient was then extubated and brought to the recovery room in stable condition. All sponge, instrument, and needle counts are correct.  Imogene Burn. Georgette Dover, MD, [facility] Surgery  General/ Trauma Surgery  11/18/2021 10:49 AM

## 2021-11-18 NOTE — Anesthesia Procedure Notes (Signed)
Procedure Name: LMA Insertion Date/Time: 11/18/2021 10:03 AM  Performed by: Imagene Riches, CRNAPre-anesthesia Checklist: Patient identified, Emergency Drugs available, Suction available and Patient being monitored Patient Re-evaluated:Patient Re-evaluated prior to induction Oxygen Delivery Method: Circle System Utilized Preoxygenation: Pre-oxygenation with 100% oxygen Induction Type: IV induction Ventilation: Mask ventilation without difficulty LMA: LMA inserted LMA Size: 4.0 Number of attempts: 1 Airway Equipment and Method: Bite block Placement Confirmation: positive ETCO2 Tube secured with: Tape Dental Injury: Teeth and Oropharynx as per pre-operative assessment

## 2021-11-19 ENCOUNTER — Encounter (HOSPITAL_BASED_OUTPATIENT_CLINIC_OR_DEPARTMENT_OTHER): Payer: Self-pay | Admitting: Surgery

## 2021-11-20 LAB — SURGICAL PATHOLOGY

## 2021-12-09 ENCOUNTER — Encounter (HOSPITAL_COMMUNITY): Payer: Self-pay

## 2022-06-03 ENCOUNTER — Other Ambulatory Visit: Payer: Self-pay | Admitting: Obstetrics and Gynecology

## 2022-06-03 DIAGNOSIS — Z9189 Other specified personal risk factors, not elsewhere classified: Secondary | ICD-10-CM

## 2022-06-04 ENCOUNTER — Telehealth: Payer: Self-pay | Admitting: Hematology and Oncology

## 2022-06-04 NOTE — Telephone Encounter (Signed)
Scheduled appt per 2/29 referral. Pt is aware of appt date and time. Pt is aware to arrive 15 mins prior to appt time and to bring and updated insurance card. Pt is aware of appt location.

## 2022-06-28 ENCOUNTER — Telehealth: Payer: Self-pay

## 2022-06-28 NOTE — Telephone Encounter (Signed)
Phone call to patient to review instructions for 13 hr prep for MRI w/ contrast on 07/06/22 @ 1140 AM. Prescription called into [facility]. Pt aware and verbalized understanding of instructions. Prescription: 07/05/22 @ 10:40 PM- 50mg  Prednisone 07/06/22 @ 4:40 AM- 50mg  Prednisone 07/06/22 @ 10:40 AM - 50mg  Prednisone and 50mg  Benadryl  Pt reports she has benadryl at home and did not want me to call it in as a prescription. The pt verbalized understanding of when to take the medication and to have a driver the day of taking benadryl.

## 2022-07-06 ENCOUNTER — Ambulatory Visit
Admission: RE | Admit: 2022-07-06 | Discharge: 2022-07-06 | Disposition: A | Discharge status: Home / Self Care | Payer: BC Managed Care – PPO | Source: Ambulatory Visit | Attending: Obstetrics and Gynecology | Admitting: Obstetrics and Gynecology

## 2022-07-06 DIAGNOSIS — Z9189 Other specified personal risk factors, not elsewhere classified: Secondary | ICD-10-CM

## 2022-07-06 MED ORDER — GADOPICLENOL 0.5 MMOL/ML IV SOLN
10.0000 mL | Freq: Once | INTRAVENOUS | Status: AC | PRN
  Administered 2022-07-06: 10 mL via INTRAVENOUS

## 2022-07-08 ENCOUNTER — Inpatient Hospital Stay: Payer: BC Managed Care – PPO | Attending: Hematology and Oncology | Admitting: Hematology and Oncology

## 2022-07-08 ENCOUNTER — Other Ambulatory Visit: Payer: Self-pay

## 2022-07-08 VITALS — BP 119/85 | HR 106 | Temp 97.3°F | Resp 18 | Ht 64.0 in | Wt 213.4 lb

## 2022-07-08 DIAGNOSIS — D242 Benign neoplasm of left breast: Secondary | ICD-10-CM | POA: Diagnosis present

## 2022-07-08 MED ORDER — LISDEXAMFETAMINE DIMESYLATE 30 MG PO CAPS: 30.0000 mg | ORAL_CAPSULE | Freq: Every day | ORAL | 0 refills | Status: AC

## 2022-07-08 MED ORDER — GABAPENTIN 100 MG PO CAPS: 100.0000 mg | ORAL_CAPSULE | Freq: Three times a day (TID) | ORAL | Status: AC

## 2022-07-08 MED ORDER — FLUOXETINE HCL 20 MG PO CAPS: 20.0000 mg | ORAL_CAPSULE | Freq: Every day | ORAL | 3 refills | Status: AC

## 2022-07-08 NOTE — Progress Notes (Signed)
[facility] NOTE  Patient Care Team: Josephina Shih, MD as PCP - General (Family Medicine)  CHIEF COMPLAINTS/PURPOSE OF CONSULTATION:  At high risk for breast cancer  HISTORY OF PRESENTING ILLNESS:  Jacqueline Mccarthy 37 y.o. female is here because of recent diagnosis of being at high risk for breast cancer.  Patient has had multiple biopsies and the multiple surgeries.  The last surgery in August 2023 was intraductal papilloma.  Previously she underwent plastic surgery to remove her ducts involving the left breast.  She did genetic testing through her gynec[facility] at [facility] and part of the workup revealed that she was high risk and she was referred to [place].  She tells me that there were no genetic mutations identified.  I reviewed her records extensively and collaborated the history with the patient.     MEDICAL HISTORY:  Past Medical History:  Diagnosis Date   Depression    IBS (irritable bowel syndrome)    Migraines    Vasovagal near syncope     SURGICAL HISTORY: Past Surgical History:  Procedure Laterality Date   BREAST LUMPECTOMY WITH RADIOACTIVE SEED LOCALIZATION Left 11/18/2021   Procedure: LEFT BREAST RADIOACTIVE SEED LOCALIZED LUMPECTOMY;  Surgeon: Donnie Mesa, MD;  Location: [place];  Service: General;  Laterality: Left;  LMA   KNEE ARTHROSCOPY     RECONSTRUCTION / CORRECTION OF NIPPLE / AEROLA      SOCIAL HISTORY: Social History   Socioeconomic History   Marital status: Single    Spouse name: Not on file   Number of children: Not on file   Years of education: Not on file   Highest education level: Not on file  Occupational History   Not on file  Tobacco Use   Smoking status: Never   Smokeless tobacco: Never  Vaping Use   Vaping Use: Never used  Substance and Sexual Activity   Alcohol use: Yes    Comment: social   Drug use: Not Currently   Sexual activity: Not on file  Other Topics Concern    Not on file  Social History Narrative   Not on file   Social Determinants of Health   Financial Resource Strain: Not on file  Food Insecurity: Not on file  Transportation Needs: Not on file  Physical Activity: Not on file  Stress: Not on file  Social Connections: Not on file  Intimate Partner Violence: Not on file    FAMILY HISTORY: Family History  Problem Relation Age of Onset   Rheum arthritis Mother    Heart attack Father    Breast cancer Paternal Aunt 82   Breast cancer Paternal Grandmother 63    ALLERGIES:  is allergic to contrast media [iodinated contrast media], lidocaine, and gadolinium derivatives.  MEDICATIONS:  Current Outpatient Medications  Medication Sig Dispense Refill   FLUoxetine (PROZAC) 20 MG capsule Take 1 capsule (20 mg total) by mouth daily.  3   gabapentin (NEURONTIN) 100 MG capsule Take 1 capsule (100 mg total) by mouth 3 (three) times daily.     lisdexamfetamine (VYVANSE) 30 MG capsule Take 1 capsule (30 mg total) by mouth daily.  0   loperamide (IMODIUM) 2 MG capsule Take 2 mg by mouth as needed.     pantoprazole (PROTONIX) 40 MG tablet Take 1 tablet by mouth daily as needed.     rizatriptan (MAXALT) 10 MG tablet Take 10 mg by mouth as needed for migraine. May repeat in 2 hours if  needed     No current facility-administered medications for this visit.    REVIEW OF SYSTEMS:   Constitutional: Denies fevers, chills or abnormal night sweats   All other systems were reviewed with the patient and are negative.  PHYSICAL EXAMINATION: ECOG PERFORMANCE STATUS: 1 - Symptomatic but completely ambulatory  Vitals:   07/08/22 1300  BP: 119/85  Pulse: (!) 106  Resp: 18  Temp: (!) 97.3 F (36.3 C)  SpO2: 99%   Filed Weights   07/08/22 1300  Weight: 213 lb 6.4 oz (96.8 kg)    GENERAL:alert, no distress and comfortable    LABORATORY DATA:  I have reviewed the data as listed Lab Results  Component Value Date   HGB 11.2 (L) 02/22/2007   HCT  33.0 (L) 02/22/2007   Lab Results  Component Value Date   NA 141 02/22/2007   K 4.4 02/22/2007   CL 111 02/22/2007    RADIOGRAPHIC STUDIES: I have personally reviewed the radiological reports and agreed with the findings in the report.  ASSESSMENT AND PLAN:  Intraductal papilloma of left breast 08/26/2021: Left breast biopsy: Intraductal papilloma 11/18/2021: Left lumpectomy: Benign intraductal papilloma  Pathology counseling: These are characterized by papillary cells that going to the lumen from the wall of the cyst. They're not concerning for malignancy but they can occasionally harbor either atypical ductal hyperplasia or DCIS. Because of this many surgeons recommend removal of these lesions. Retrospective studies have revealed that 15% of them could be upgraded to malignancy following surgical resection. Since the final pathology was benign there is no further treatment necessary for the papilloma.  1.  Risk assessment: ABaker Janus model: 5-year risk: 0.9% (average risk 0.3%) B.  Tyrer-Cuzick model: 10-year risk: 2% (average risk 1.1%) C.  Tyrer-Cuzick model: Lifetime risk: 19.8% (average risk 11%)  2. Risk reduction: A.  Pharmacological risk reduction: Tamoxifen versus raloxifene: Did not record mended based upon her 5-year risk B.  Nonpharmacological risk reduction: Stress importance of eating healthy, diet, exercise, supplements like vitamin D and turmeric, avoiding alcohol  3.  Breast cancer surveillance: A.  Mammograms annually B.  Breast MRIs annually   Patient is already on the breast MRI surveillance which she will continue. 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   Return to clinic on an as-needed basis.   All questions were answered. The patient knows to call the clinic with any problems, questions or concerns.    Harriette Ohara, MD 07/08/22

## 2022-07-08 NOTE — Assessment & Plan Note (Addendum)
08/26/2021: Left breast biopsy: Intraductal papilloma 11/18/2021: Left lumpectomy: Benign intraductal papilloma  Pathology counseling: These are characterized by papillary cells that going to the lumen from the wall of the cyst. They're not concerning for malignancy but they can occasionally harbor either atypical ductal hyperplasia or DCIS. Because of this many surgeons recommend removal of these lesions. Retrospective studies have revealed that 15% of them could be upgraded to malignancy following surgical resection. Since the final pathology was benign there is no further treatment necessary for the papilloma.  1.  Risk assessment: ABaker Janus model: 5-year risk: 0.9% (average risk 0.3%) B.  Tyrer-Cuzick model: 10-year risk: 2% (average risk 1.1%) C.  Tyrer-Cuzick model: Lifetime risk: 19.8% (average risk 11%)  2. Risk reduction: A.  Pharmacological risk reduction: Tamoxifen versus raloxifene: Did not record mended based upon her 5-year risk B.  Nonpharmacological risk reduction: Stress importance of eating healthy, diet, exercise, supplements like vitamin D and turmeric, avoiding alcohol  3.  Breast cancer surveillance: A.  Mammograms annually B.  Breast MRIs annually   Patient is already on the breast MRI surveillance which she will continue. 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

## 2023-01-24 ENCOUNTER — Other Ambulatory Visit: Payer: Self-pay | Admitting: Family

## 2023-01-24 DIAGNOSIS — N644 Mastodynia: Secondary | ICD-10-CM

## 2023-02-14 ENCOUNTER — Other Ambulatory Visit: Payer: BC Managed Care – PPO

## 2023-02-23 ENCOUNTER — Other Ambulatory Visit: Payer: BC Managed Care – PPO

## 2023-02-23 IMAGING — MG MM BREAST LOCALIZATION CLIP
4 series · 4 of 12 positions shown · non-contrast
Comparison: Previous exam(s).

CLINICAL DATA: Confirmation of clip placement after MRI guided core
needle biopsy of non-mass enhancement involving the LOWER INNER
QUADRANT of the LEFT breast at anterior depth.

EXAM:
2D and 3D DIAGNOSTIC LEFT MAMMOGRAM POST MRI BIOPSY

[L CC synth-2D]
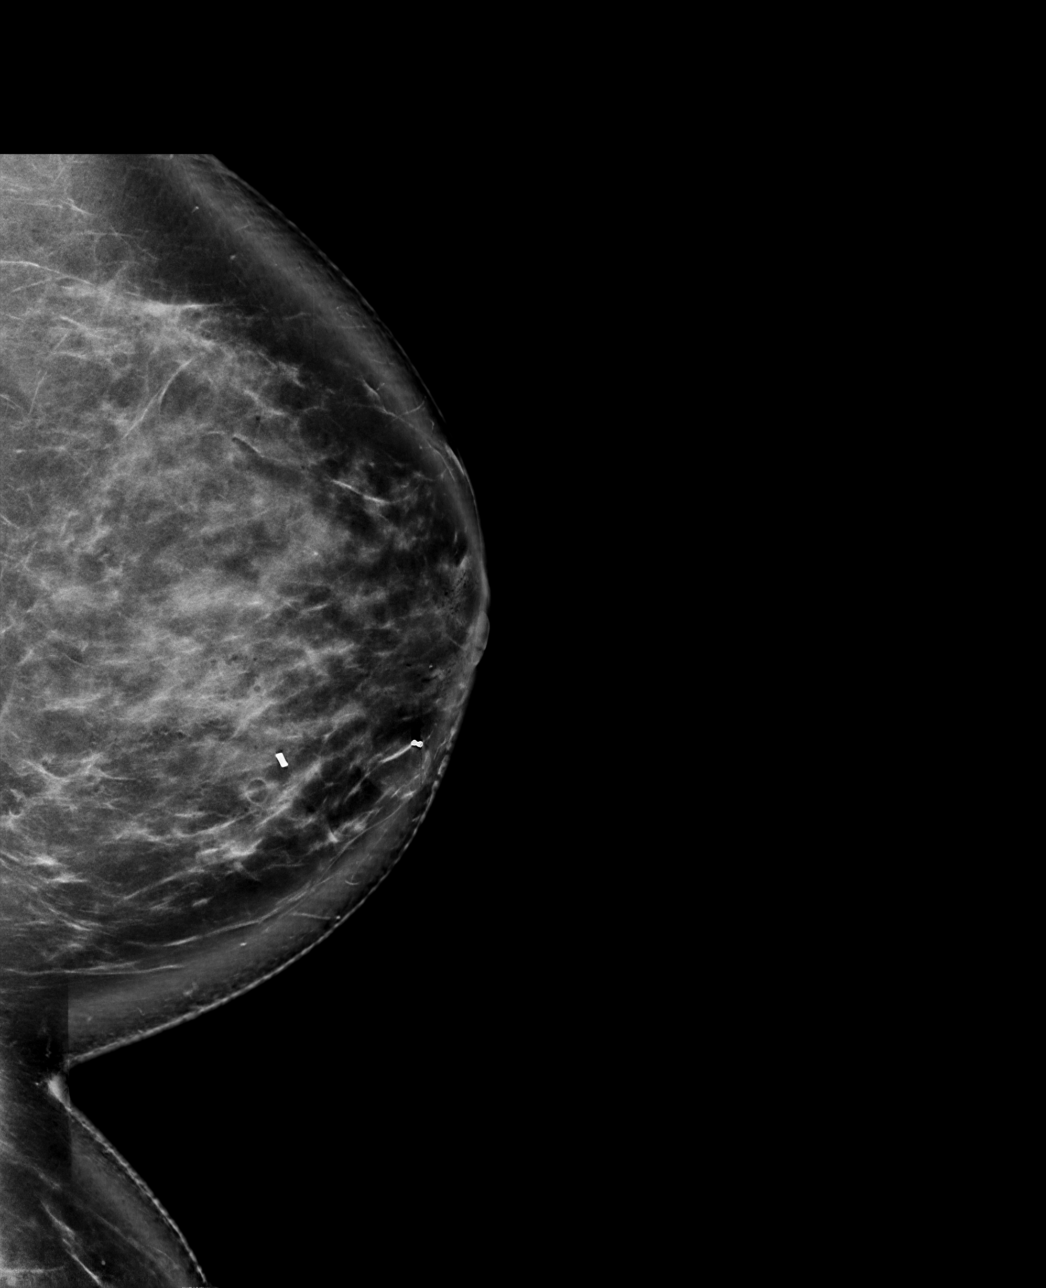

[L ML synth-2D]
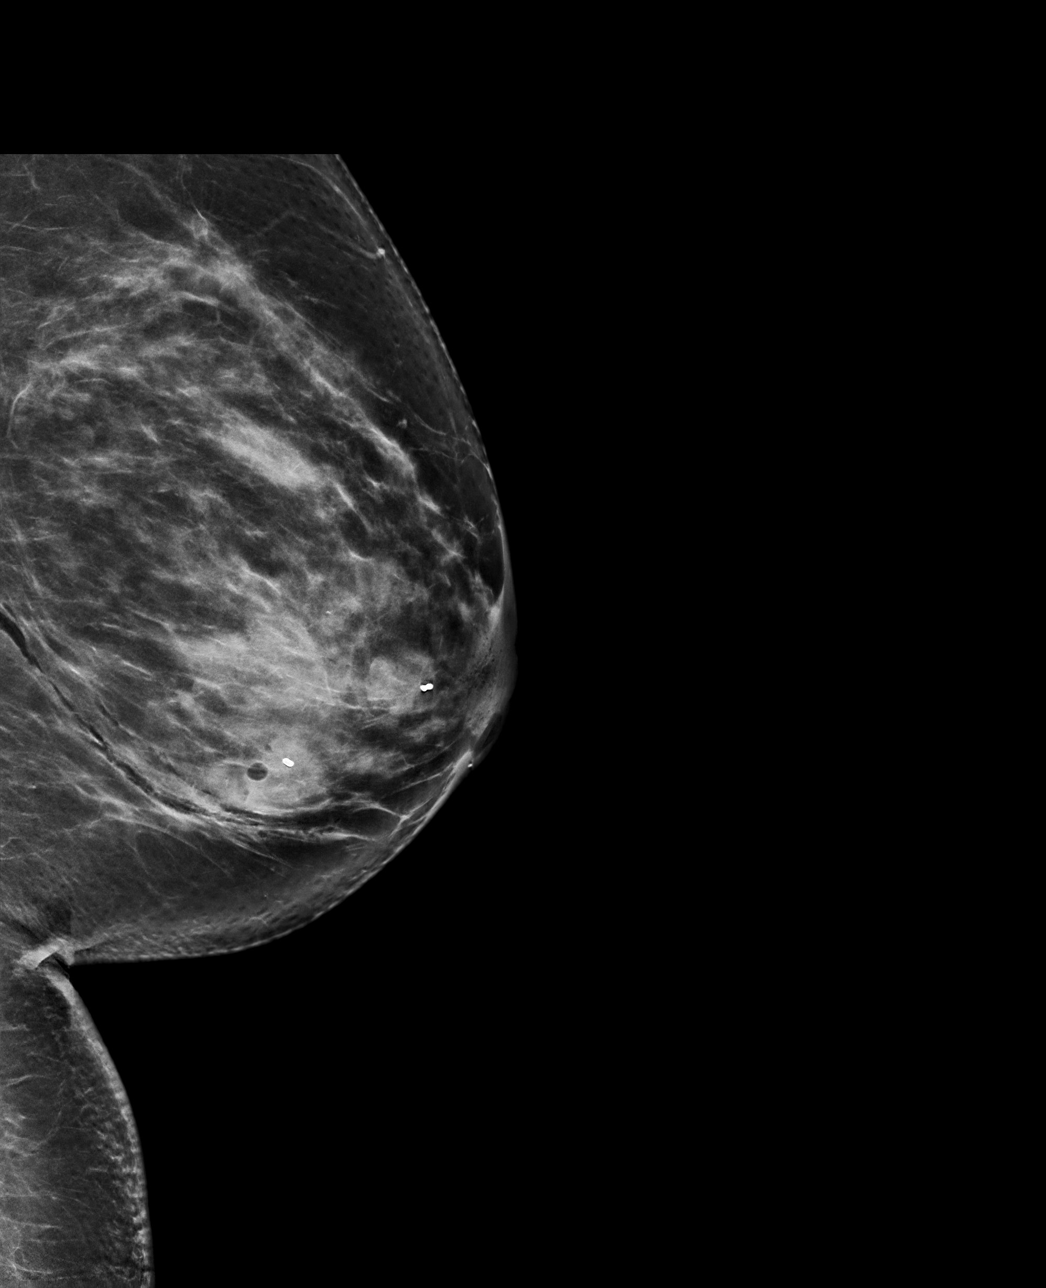

[L ML tomo · tomo slice 49/96.0]
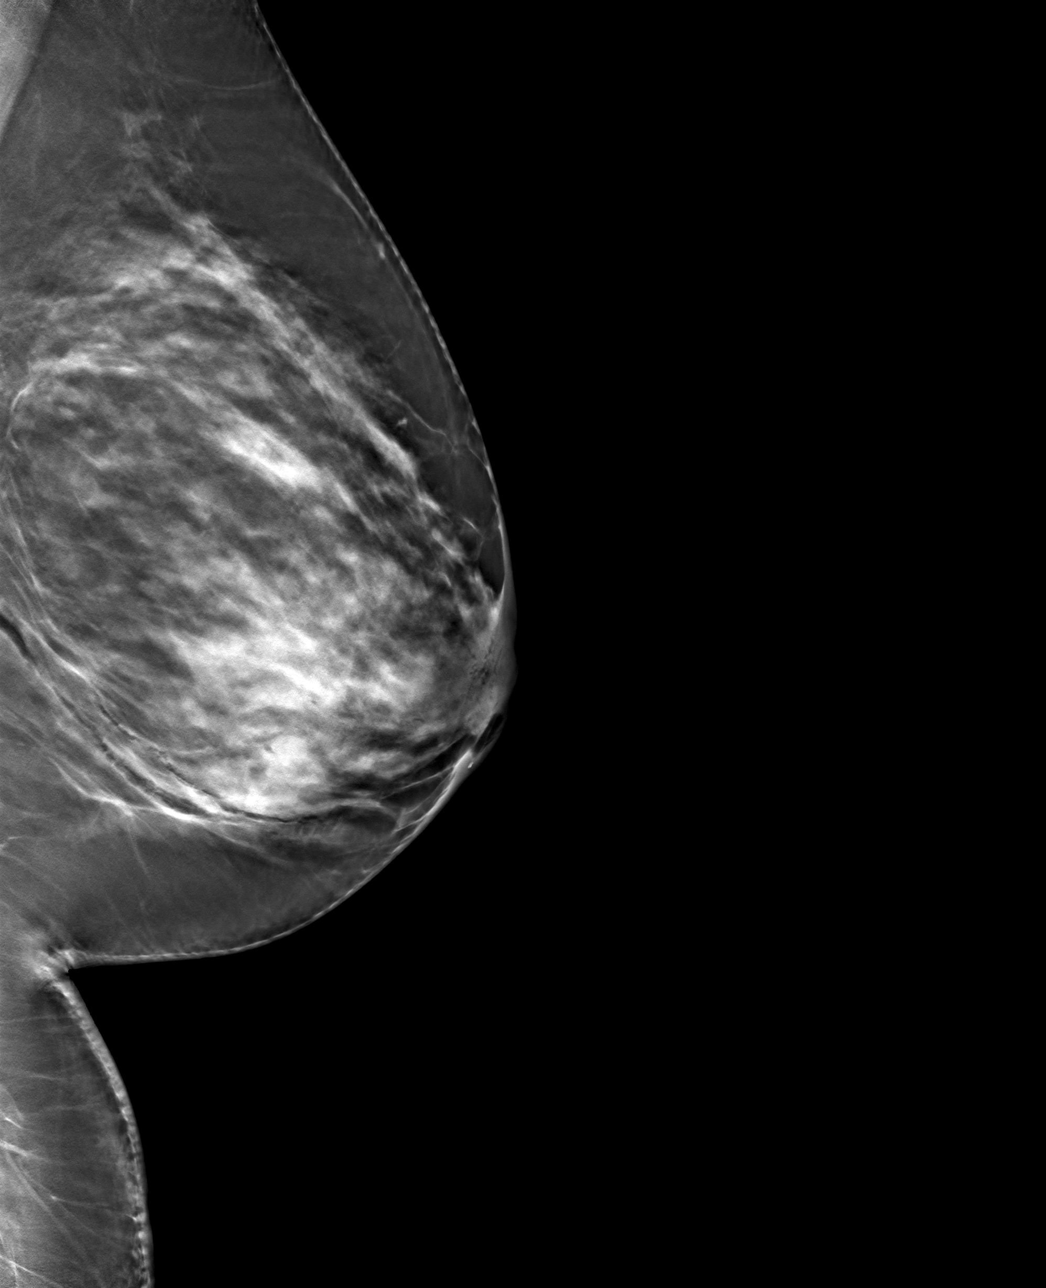

[L CC tomo · tomo slice 55/110.0]
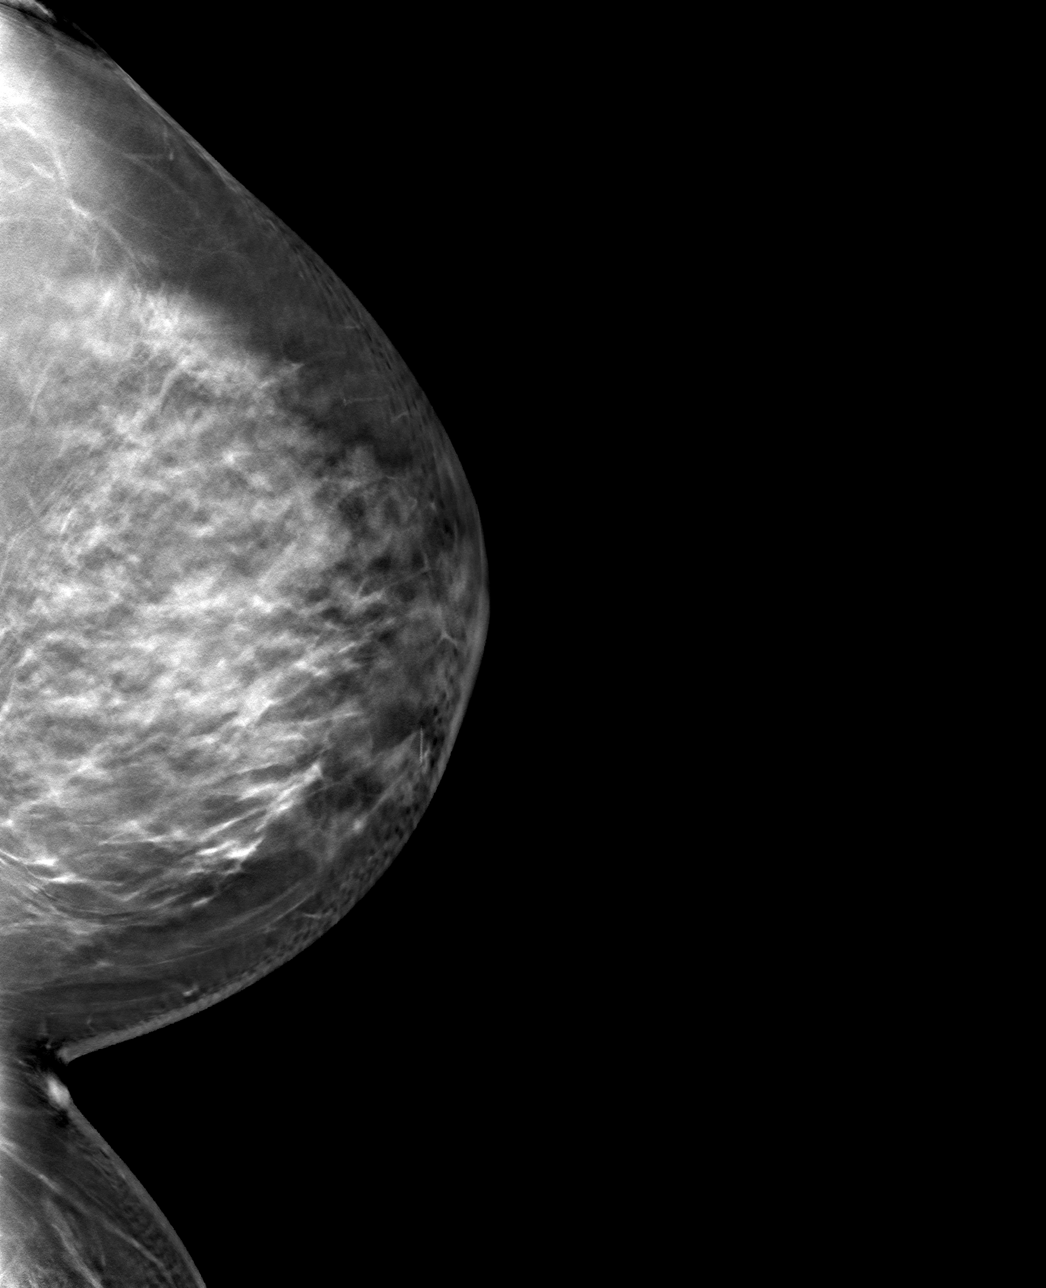

[4 of 12 positions shown; findings below may reference images not displayed]

FINDINGS: 2D and 3D full field CC and mediolateral images were obtained
following MRI guided biopsy of non-mass enhancement involving the
LOWER INNER QUADRANT of the LEFT breast at anterior depth. The
cylinder shaped tissue marking clip is appropriately positioned at
the site of the biopsy.

Expected post biopsy changes are present without evidence of
hematoma. Note is made of a residual approximate 1.5 cm hematoma at
the site of prior biopsy in the inner subareolar location.
IMPRESSION: Appropriate positioning of the cylinder shaped biopsy marking clip
at the site of biopsy in the LOWER INNER QUADRANT of the LEFT breast
at anterior depth.

Final Assessment: Post Procedure Mammograms for Marker Placement

## 2023-02-23 IMAGING — MR MR BREAST BX W LOC DEV 1ST LESION IMAGE BX SPEC MR GUIDE*L*
7 of 10 series · 30 of 48 positions shown · IV contrast (10 ml gadavist)
Comparison: Previous exams.
COMPARISON: Previous exams.

Addendum:
CLINICAL DATA: 35-year-old with a personal history of resection of
a LEFT nipple adenoma in 1374 with nipple reconstruction. Recent
high risk supplemental screening MRI demonstrated non-mass
enhancement in the inner breast at anterior depth, recent
biopsy-proven papilloma. She also had asymmetric enhancement in the
LOWER INNER QUADRANT of the LEFT breast at anterior depth which is
sampled today.

EXAM:
MRI GUIDED CORE NEEDLE BIOPSY OF THE LEFT BREAST
TECHNIQUE: Multiplanar, multisequence MR imaging of the LEFT breast was
performed both before and after administration of intravenous
contrast.

[Series 2: fiducial unilateral · sagittal · 2.0mm · 1.33mm/px · 3 of 56 slices shown]
[im 1/56]
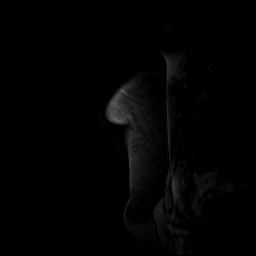
[im 28/56]
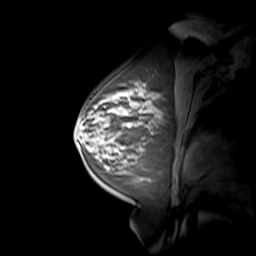
[im 56/56]
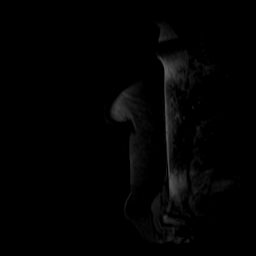

[Series 3: dynamic pre · axial · non-contrast · 1.3mm · 0.73mm/px · z∈[-92,+93]mm · 5 of 144 slices shown]
[im 1/144]
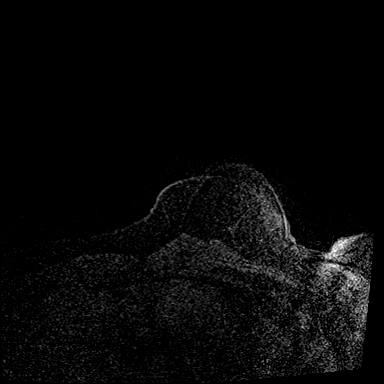
[im 36/144]
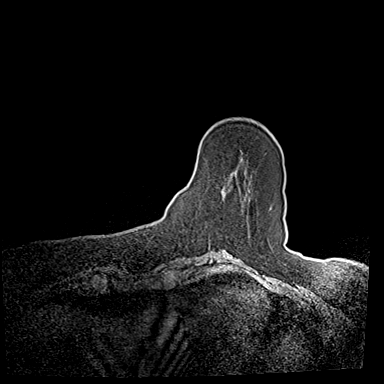
[im 72/144]
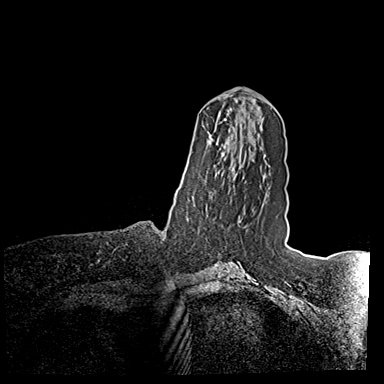
[im 108/144]
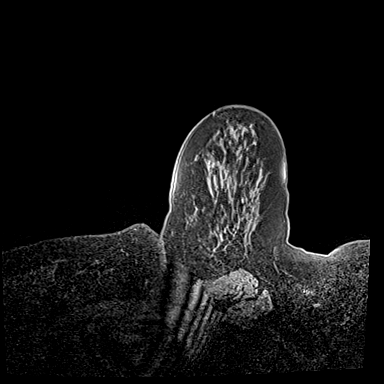
[im 144/144]
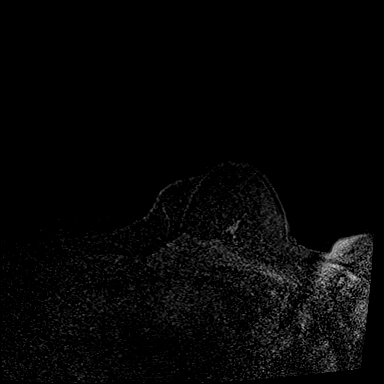

[Series 4: dynamic post 20 · axial · 1.3mm · 0.73mm/px · z∈[-92,+93]mm · 5 of 144 slices shown (1 of 2)]
[im 1/144]
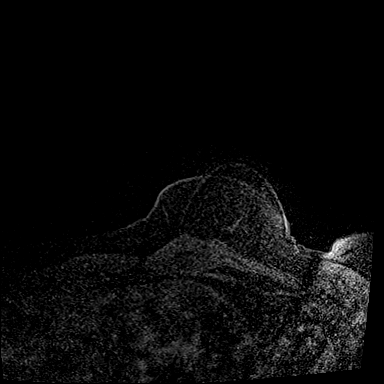
[im 36/144]
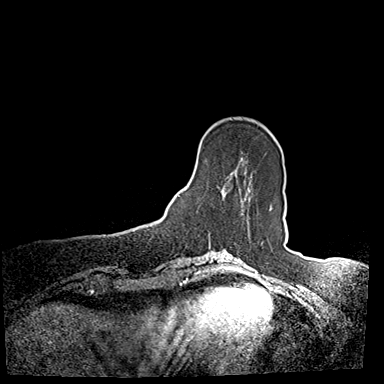
[im 72/144]
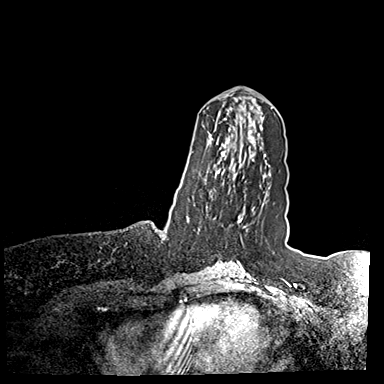
[im 108/144]
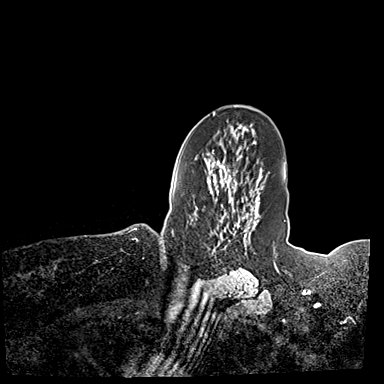
[im 144/144]
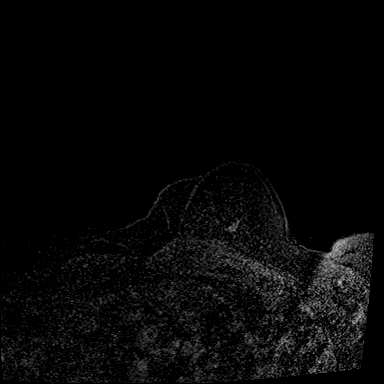

[Series 5: dynamic post 20 · axial · 1.3mm · 0.73mm/px · z∈[-92,+93]mm · 5 of 144 slices shown (2 of 2)]
[im 1/144]
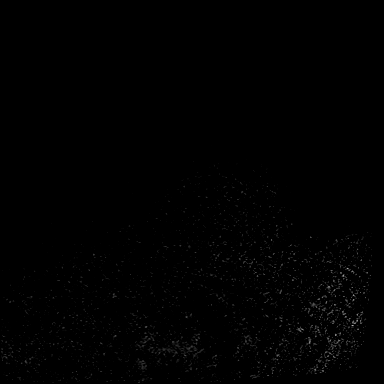
[im 36/144]
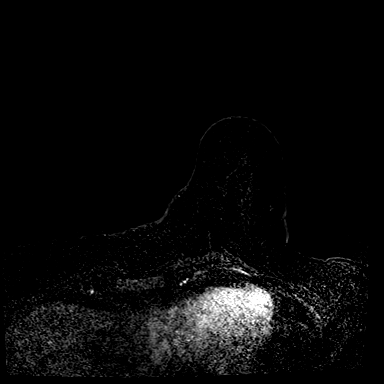
[im 72/144]
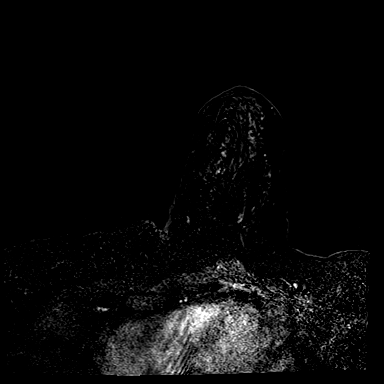
[im 108/144]
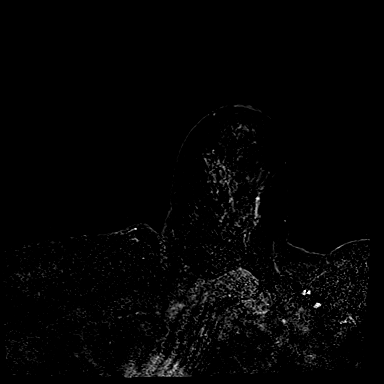
[im 144/144]
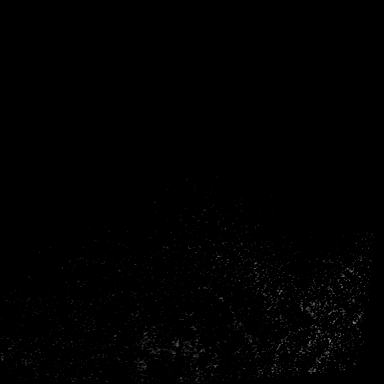

[Series 6: dynamic post 3 · axial · 1.3mm · 0.73mm/px · z∈[-92,+93]mm · 5 of 144 slices shown (1 of 2)]
[im 1/144]
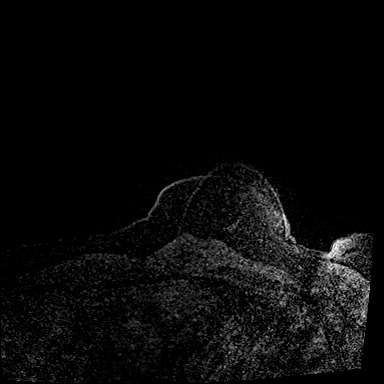
[im 36/144]
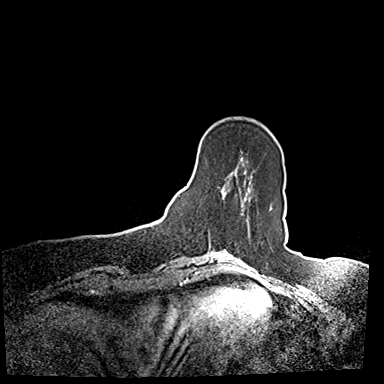
[im 72/144]
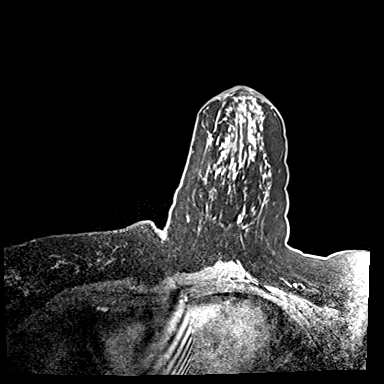
[im 108/144]
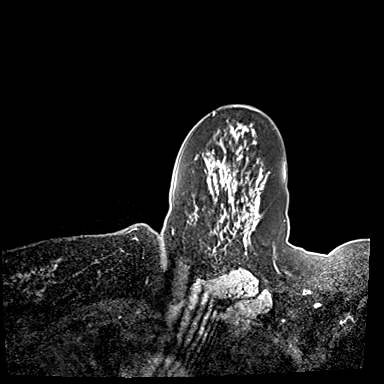
[im 144/144]
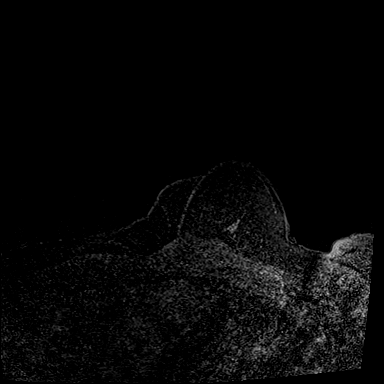

[Series 7: dynamic post 3 · axial · 1.3mm · 0.73mm/px · z∈[-92,+93]mm · 5 of 144 slices shown (2 of 2)]
[im 1/144]
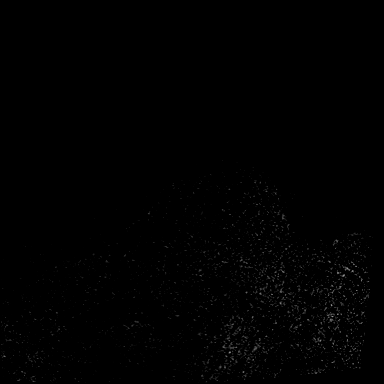
[im 36/144]
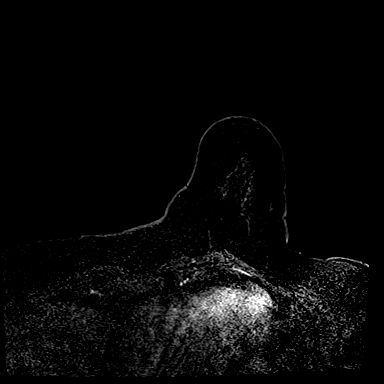
[im 72/144]
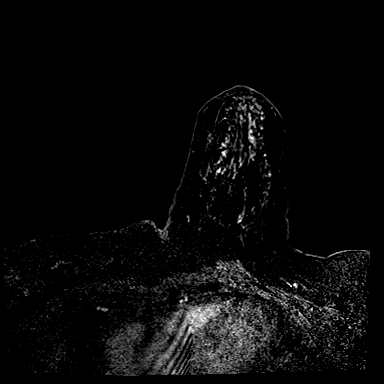
[im 108/144]
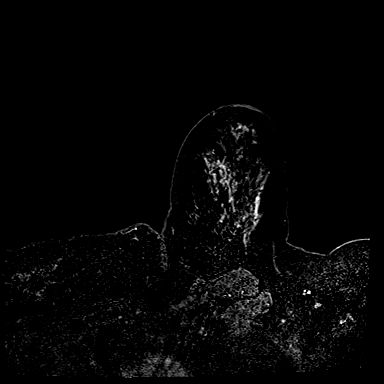
[im 144/144]
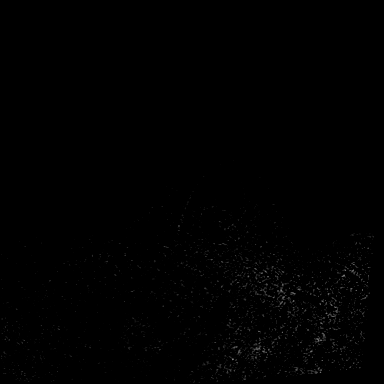

[Series 8: needle confirmation · axial · 1.3mm · 0.73mm/px · z∈[-92,-47]mm · 2 of 144 slices shown]
[im 1/144]
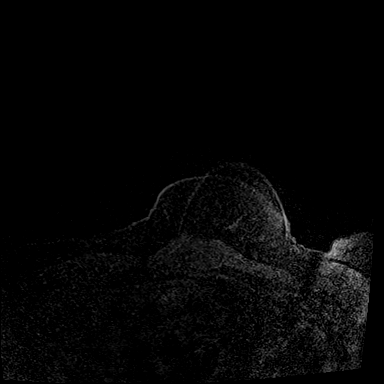
[im 36/144]
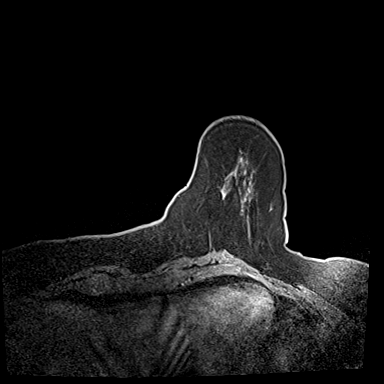

[30 of 48 positions shown; findings below may reference images not displayed]

CONTRAST:  10 mL Gadavist IV.

The patient had a possible allergic reaction to the gadolinium
contrast, the lidocaine, or the epinephrine at the time of the prior
MRI biopsy on 08/26/2021. She had the standard 13 hour contrast
allergy preparation with prednisone and Benadryl prior to today's
procedure. There were no immediate complications.
FINDINGS: I met with the patient, and we discussed the procedure of MRI guided
biopsy, including risks, benefits, and alternatives. Specifically,
we discussed the risks of infection, bleeding, tissue injury, clip
migration, and inadequate sampling. Informed, written consent was
given. The usual time out protocol was performed immediately prior
to the procedure.

Lesion quadrant: LOWER INNER QUADRANT.

Using sterile technique with chlorhexidine as skin antisepsis, 1%
lidocaine and 1% lidocaine with epinephrine as local anesthetic,
using MRI guidance, a 9 gauge vacuum assisted device was used
perform biopsy of non-mass enhancement in the LOWER INNER QUADRANT
using a lateral approach. At the conclusion of the procedure, a
cylinder shaped tissue marker clip was deployed into the biopsy
cavity. Follow-up 2-view mammogram was performed and dictated
separately.
IMPRESSION: MRI guided biopsy of non-mass enhancement involving the LOWER INNER
QUADRANT of the LEFT breast at anterior depth. No apparent
complications.

ADDENDUM:
Pathology revealed FIBROCYSTIC CHANGE, INCLUDING FIBROADENOMATOID
CHANGE, APOCRINE METAPLASIA AND USUAL DUCTAL HYPERPLASIA of the LEFT
breast, lower inner, (cylinder clip). This was found to be
concordant by Dr. Chevo Borrelli.

Pathology results were discussed with the patient by telephone. The
patient reported doing well after the biopsy with tenderness and
bruising at the site. Post biopsy instructions and care were
reviewed and questions were answered. The patient was encouraged to
call The [REDACTED] for any additional
concerns. My direct phone number was provided.

The patient has a recent diagnosis of LEFT breast INTRADUCTAL
PAPILLOMA and should follow her outlined treatment plan. She is
scheduled to see Dr. Zuki Kuku at [REDACTED] on
September 25, 2021.

NOTE: The patient had a possible allergic reaction to the gadolinium
contrast, the lidocaine, or the epinephrine at the time of the prior
MRI biopsy on 08/26/2021. She had the standard 13 hour contrast
allergy preparation with prednisone and Benadryl prior to today's
procedure. There were no immediate complications at the time of
biopsy. There were no post biopsy complications, per patient.

If lifetime risk > 20%, screening mammogram in 1 year and
supplemental screening MRI 6 months thereafter.

Pathology results reported by Enrico Berhe, RN on 09/11/2021.

*** End of Addendum ***
CONTRAST:  10 mL Gadavist IV.

The patient had a possible allergic reaction to the gadolinium
contrast, the lidocaine, or the epinephrine at the time of the prior
MRI biopsy on 08/26/2021. She had the standard 13 hour contrast
allergy preparation with prednisone and Benadryl prior to today's
procedure. There were no immediate complications.
FINDINGS: I met with the patient, and we discussed the procedure of MRI guided
biopsy, including risks, benefits, and alternatives. Specifically,
we discussed the risks of infection, bleeding, tissue injury, clip
migration, and inadequate sampling. Informed, written consent was
given. The usual time out protocol was performed immediately prior
to the procedure.

Lesion quadrant: LOWER INNER QUADRANT.

Using sterile technique with chlorhexidine as skin antisepsis, 1%
lidocaine and 1% lidocaine with epinephrine as local anesthetic,
using MRI guidance, a 9 gauge vacuum assisted device was used
perform biopsy of non-mass enhancement in the LOWER INNER QUADRANT
using a lateral approach. At the conclusion of the procedure, a
cylinder shaped tissue marker clip was deployed into the biopsy
cavity. Follow-up 2-view mammogram was performed and dictated
separately.
IMPRESSION: MRI guided biopsy of non-mass enhancement involving the LOWER INNER
QUADRANT of the LEFT breast at anterior depth. No apparent
complications.

## 2023-03-08 ENCOUNTER — Ambulatory Visit
Admission: RE | Admit: 2023-03-08 | Discharge: 2023-03-08 | Disposition: A | Discharge status: Home / Self Care | Payer: BC Managed Care – PPO | Source: Ambulatory Visit | Attending: Family | Admitting: Family

## 2023-03-08 ENCOUNTER — Ambulatory Visit: Payer: BC Managed Care – PPO

## 2023-03-08 DIAGNOSIS — N644 Mastodynia: Secondary | ICD-10-CM

## 2023-06-23 ENCOUNTER — Other Ambulatory Visit: Payer: Self-pay | Admitting: Obstetrics and Gynecology

## 2023-06-23 DIAGNOSIS — Z9189 Other specified personal risk factors, not elsewhere classified: Secondary | ICD-10-CM

## 2023-08-04 ENCOUNTER — Telehealth: Payer: Self-pay

## 2023-08-04 MED ORDER — PREDNISONE 50 MG PO TABS: ORAL_TABLET | ORAL | 0 refills | Status: AC

## 2023-08-04 NOTE — Telephone Encounter (Signed)
 Phone call to patient to review instructions for 13 hr prep for MR w/ contrast on 08/08/23  at 1:50PM. Prescription called into [facility]. Pt aware and verbalized understanding of instructions. Prescription:  Pt to take 50 mg of prednisone  on 08/08/23 at 12:50AM, 50 mg of prednisone  on 08/08/23 at 6:50AM, and 50 mg of prednisone  on 08/08/23 at 12:50PM. Pt is also to take 50 mg of benadryl on 08/08/23 at 12:50PM.

## 2023-08-08 ENCOUNTER — Ambulatory Visit
Admission: RE | Admit: 2023-08-08 | Discharge: 2023-08-08 | Disposition: A | Discharge status: Home / Self Care | Source: Ambulatory Visit | Attending: Obstetrics and Gynecology | Admitting: Obstetrics and Gynecology

## 2023-08-08 DIAGNOSIS — Z9189 Other specified personal risk factors, not elsewhere classified: Secondary | ICD-10-CM

## 2023-08-08 MED ORDER — GADOPICLENOL 0.5 MMOL/ML IV SOLN
10.0000 mL | Freq: Once | INTRAVENOUS | Status: AC | PRN
  Administered 2023-08-08: 10 mL via INTRAVENOUS
